# Patient Record
Sex: Male | Born: 2000 | Race: Black or African American | Hispanic: No | Marital: Single | Smoking: Never smoker
Health system: Southern US, Community
[De-identification: ages and names within clinical notes are randomized; demographics above are authoritative.]

---
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
## 2022-02-06 IMAGING — DX DG KNEE COMPLETE 4+V*R*
4 series · 4 of 4 positions shown · non-contrast
Comparison: None.

CLINICAL DATA: Right knee pain after injury.

EXAM:
RIGHT KNEE - COMPLETE 4+ VIEW

[knee ap]
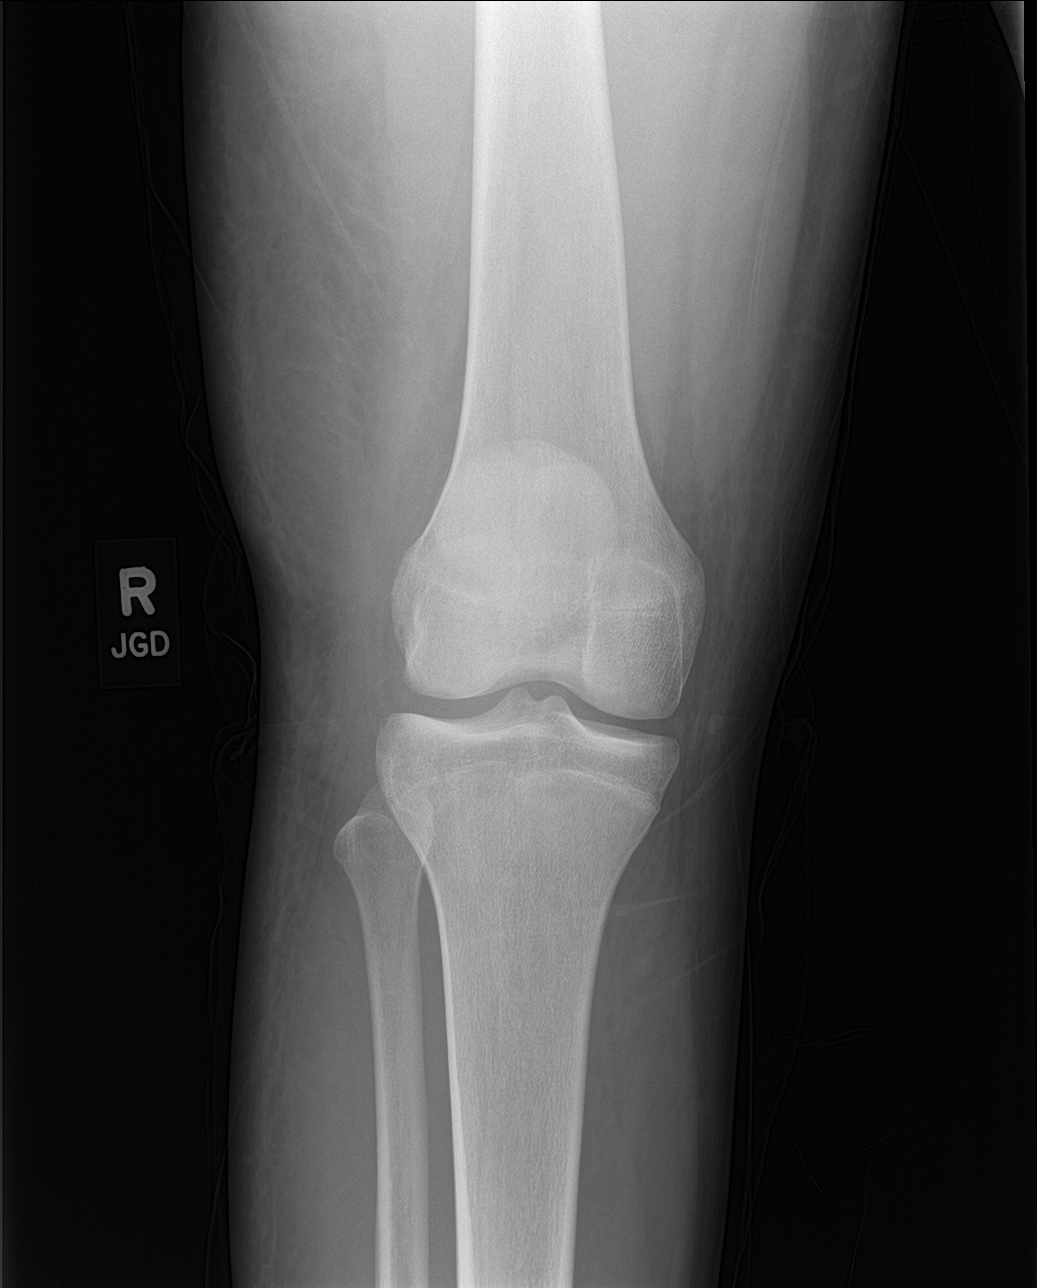

[knee obl (1 of 2)]
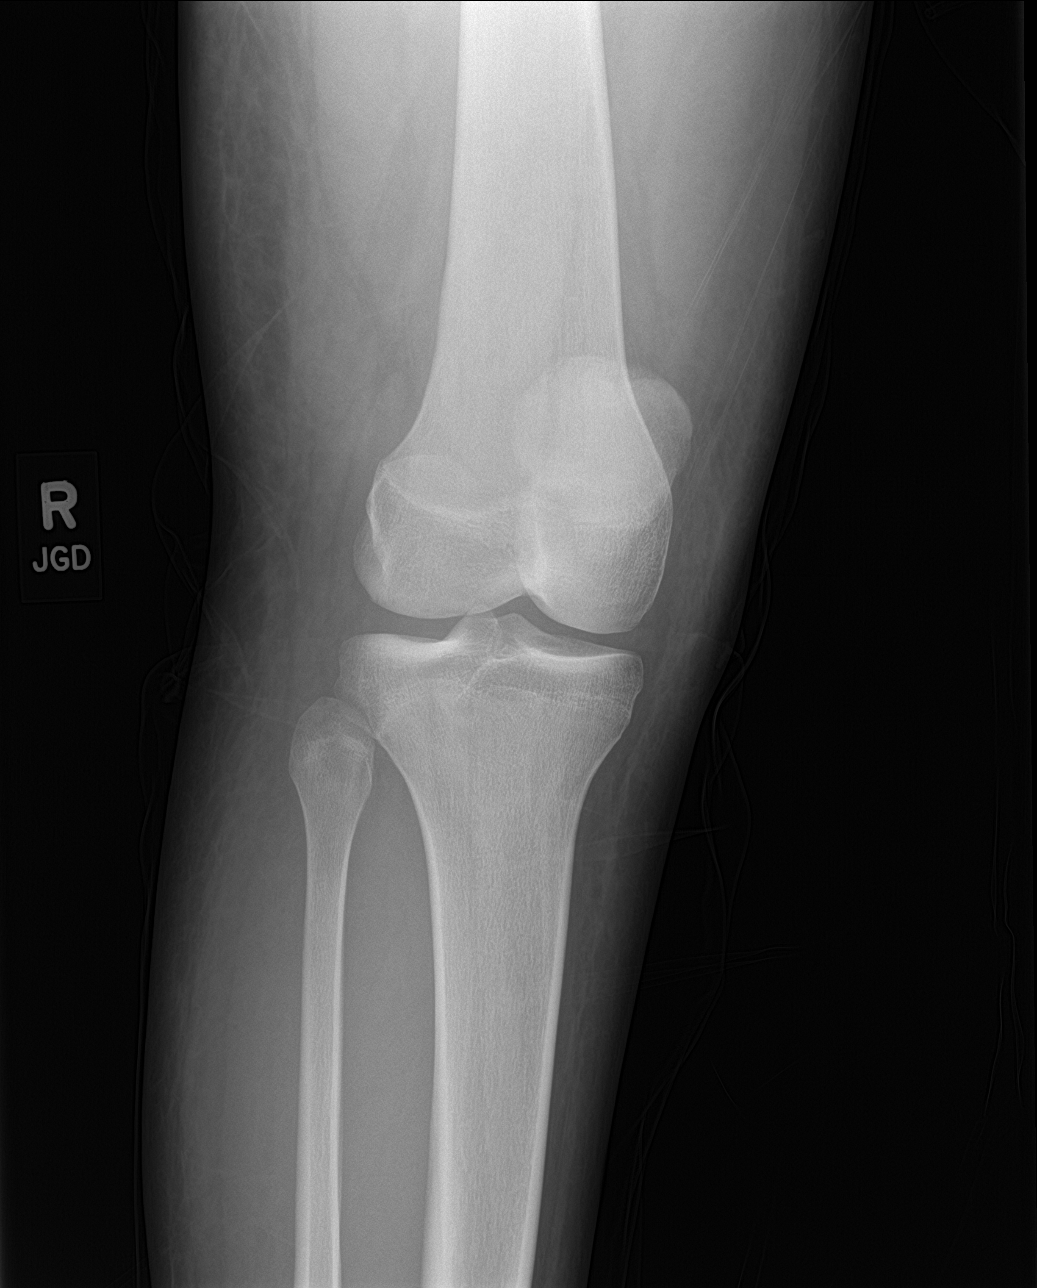

[knee obl (2 of 2)]
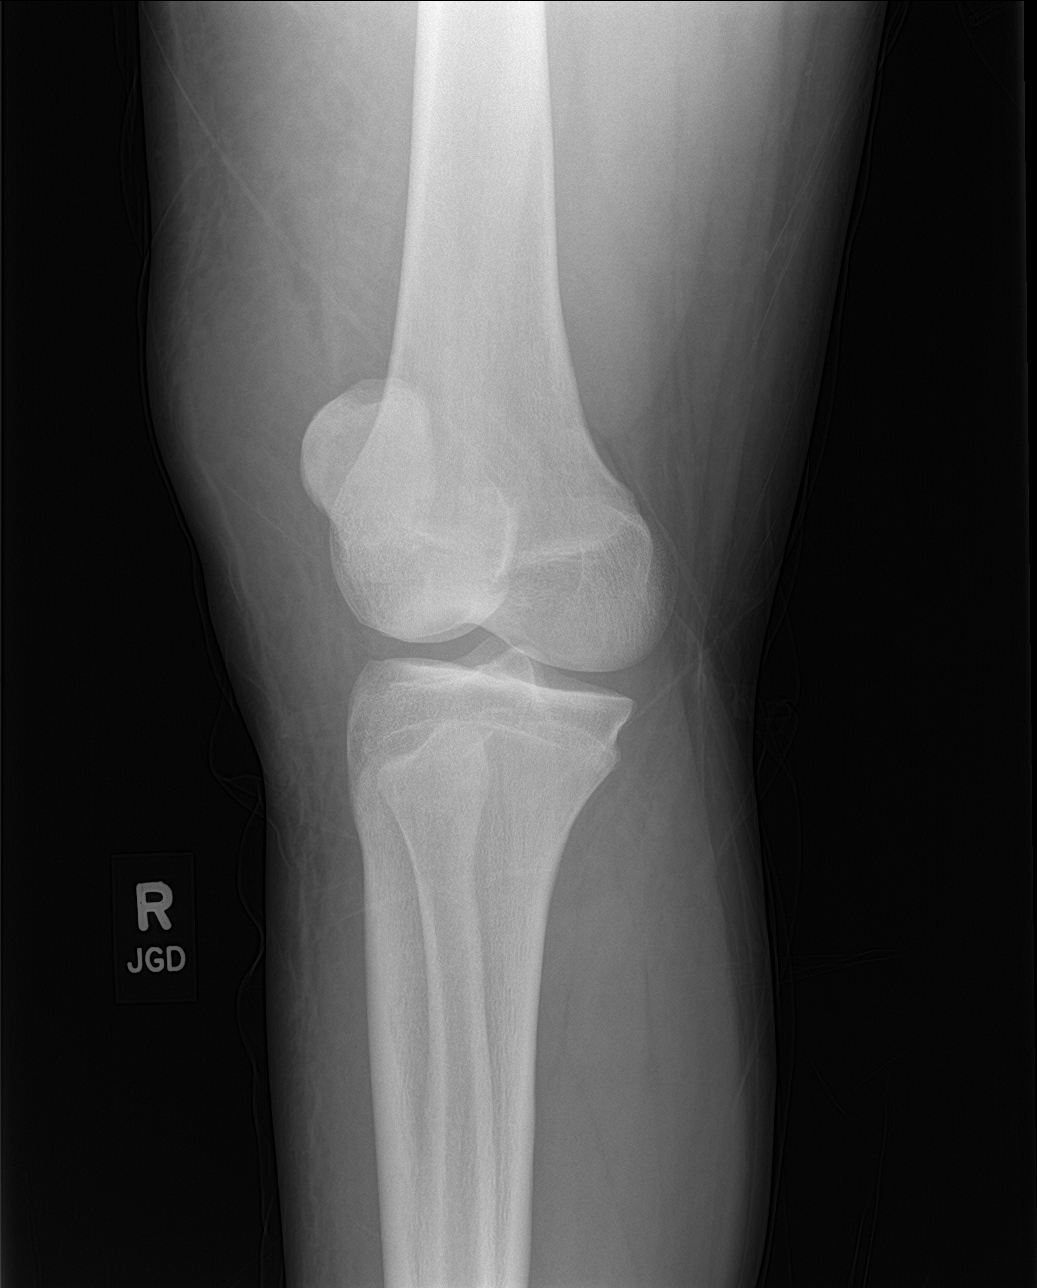

[knee lat]
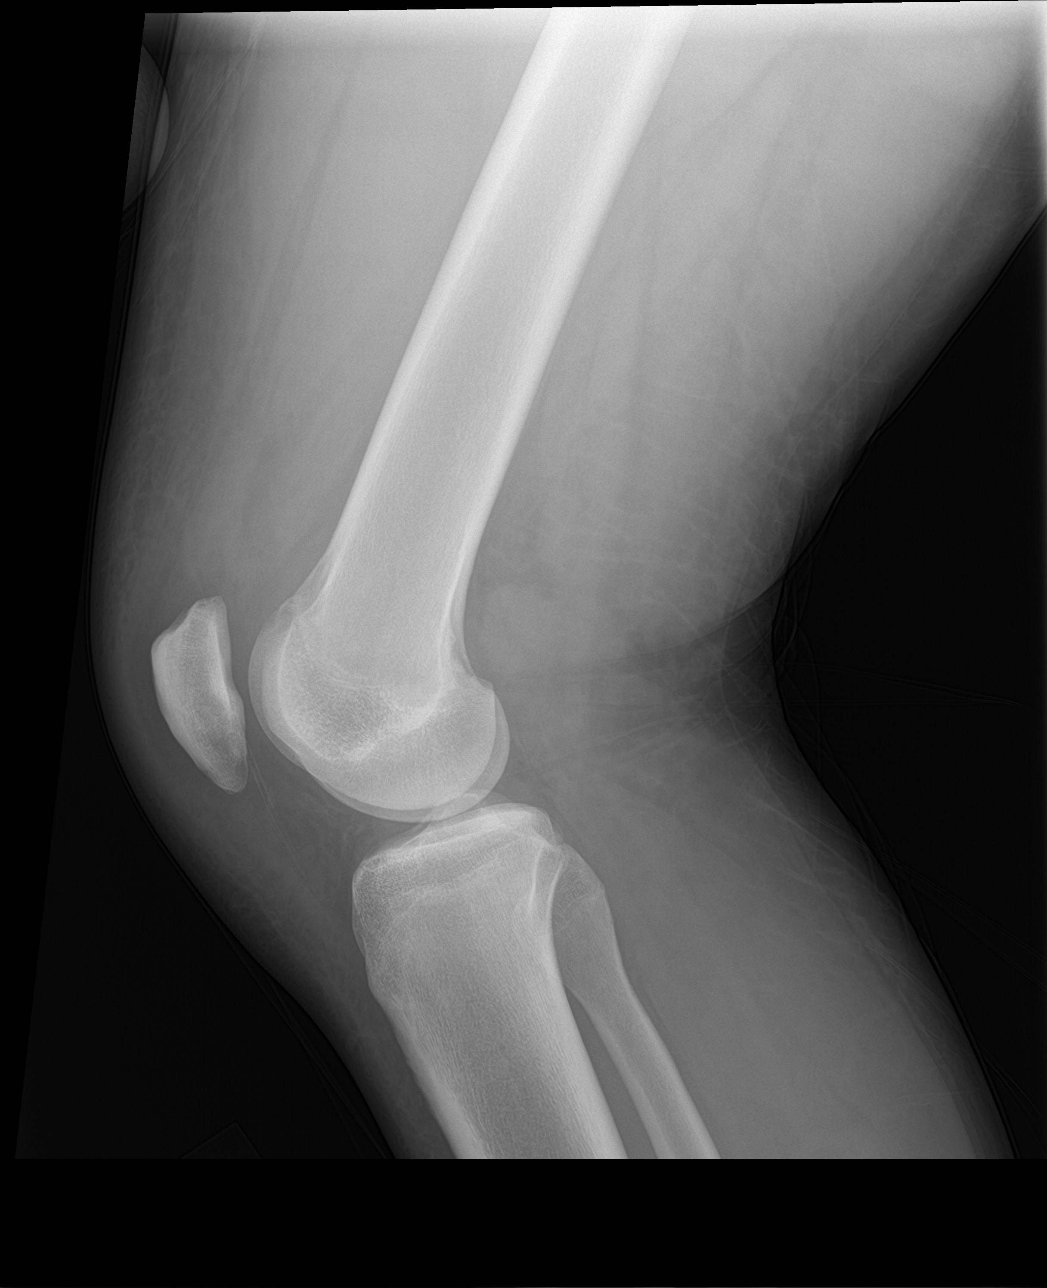

[4 of 4 positions shown; findings below may reference images not displayed]

FINDINGS: No evidence of fracture, dislocation, or joint effusion. No evidence
of arthropathy or other focal bone abnormality. Soft tissues are
unremarkable.
IMPRESSION: Negative.
